# Patient Record
Sex: Male | Born: 1980 | Race: White | Hispanic: No | Marital: Single | Smoking: Never smoker
Health system: Southern US, Community
[De-identification: ages and names within clinical notes are randomized; demographics above are authoritative.]

## PROBLEM LIST (undated history)

## (undated) DIAGNOSIS — I10 Essential (primary) hypertension: Secondary | ICD-10-CM

---

## 2008-06-13 ENCOUNTER — Ambulatory Visit (HOSPITAL_BASED_OUTPATIENT_CLINIC_OR_DEPARTMENT_OTHER): Admission: RE | Admit: 2008-06-13 | Discharge: 2008-06-13 | Payer: Self-pay | Admitting: Orthopedic Surgery

## 2010-03-07 ENCOUNTER — Ambulatory Visit: Payer: Self-pay | Admitting: Diagnostic Radiology

## 2010-03-07 ENCOUNTER — Emergency Department (HOSPITAL_BASED_OUTPATIENT_CLINIC_OR_DEPARTMENT_OTHER): Admission: EM | Admit: 2010-03-07 | Discharge: 2010-03-07 | Payer: Self-pay | Admitting: Emergency Medicine

## 2011-01-26 NOTE — Op Note (Signed)
NAME:  Juan Ayala, Juan Ayala               ACCOUNT NO.:  [id number]   MEDICAL RECORD NO.:  [id number]          PATIENT TYPE:  AMB   LOCATION:  DSC                          FACILITY:  [facility]   PHYSICIAN:  Katy Fitch. Sypher, M.D. DATE OF BIRTH:  29-Apr-1981   DATE OF PROCEDURE:  06/13/2008  DATE OF DISCHARGE:                               OPERATIVE REPORT   PREOPERATIVE DIAGNOSIS:  Unstable right small finger metacarpal neck  fracture.   POSTOPERATIVE DIAGNOSIS:  Unstable right small finger metacarpal neck  fracture.   OPERATION:  Open reduction and internal fixation of right small finger  metacarpal neck fracture.   OPERATING SURGEON:  Katy Fitch. Sypher, MD   ASSISTANT:  Marveen Reeks Dasnoit, PA-C   ANESTHESIA:  General by LMA.   SUPERVISING ANESTHESIOLOGIST:  Janetta Hora. Gelene Mink, MD   INDICATIONS:  Amaar Oshita is a 30 year old right-hand dominant  gentleman, who sustained a crushing injury to his right hand earlier  this week, sustaining a mildly comminuted, angulated fracture of the  right small finger metacarpal neck at the metaphyseal-epiphyseal  junction.  This was significantly displaced and unstable.   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  Clinical examination revealed mild  malrotation of the small finger and apex dorsal angulation.  We  recommended a closed reduction and percutaneous Kirschner wire fixation  to obtain and maintain an anatomic reduction.   Preoperatively, Mr. Cowden was advised the potential risks and benefits of  surgery.  Questions were invited and answered in detail.   PROCEDURE:  Kelsie Kramp was brought to the operating room and placed  in supine position upon the operating table.   Following the induction of general anesthesia by LMA technique, the  right arm was prepped with Betadine soap solution and sterilely draped.  A pneumatic tourniquet was applied at the proximal brachium.   Following  exsanguination of the right arm with an Esmarch bandage,  arterial tourniquet was inflated to 220 mmHg.  Procedure commenced with  a closed reduction maneuver with 90/90 flexion of the MP and PIP joints.  Rotation was corrected and with 3-point molding, a snap was heard  reducing the fracture anatomically.   Two 0.045-inch Kirschner wires were placed on an oblique angle across  the small and ring finger metacarpal heads, the small and ring finger  metacarpal metaphyses, and an oblique pin placed through the epiphysis  of the small finger metacarpal head into the metaphysis of the ring  finger metacarpal.  A stable construct was achieved.  The C-arm  fluoroscopic images were used, AP, lateral, and oblique to confirm  satisfactory reduction and pin placement.   The pins were trimmed, bent in the usual manner, dressed with Xeroflo,  followed by application of a sterile gauze and Webril bandage with a  sandwich splint maintaining the ring and small fingers in the safe  position.   There were no apparent complications.   Mr. Graumann tolerated the surgery and anesthesia well.  He was transferred  to recovery room in stable signs.   He will be discharged to  the care of his family with a preexisting  prescription for Percocet that he had prior to surgery.  He was provided  vancomycin 1 g IV intraoperatively and will not be provided further  prophylactic antibiotics.       Katy Fitch Sypher, M.D.  Electronically Signed     RVS/MEDQ  D:  06/13/2008  T:  06/14/2008  Job:  [id number]

## 2011-06-15 LAB — POCT HEMOGLOBIN-HEMACUE: Hemoglobin: 15.6

## 2011-12-14 IMAGING — CR DG FINGER INDEX 2+V*L*
3 series · 3 of 3 positions shown · non-contrast
Comparison: None.

CLINICAL DATA: Crush injury to index finger.  Finger bruising and
pain.

LEFT INDEX FINGER 2+V

[x finger pa left]
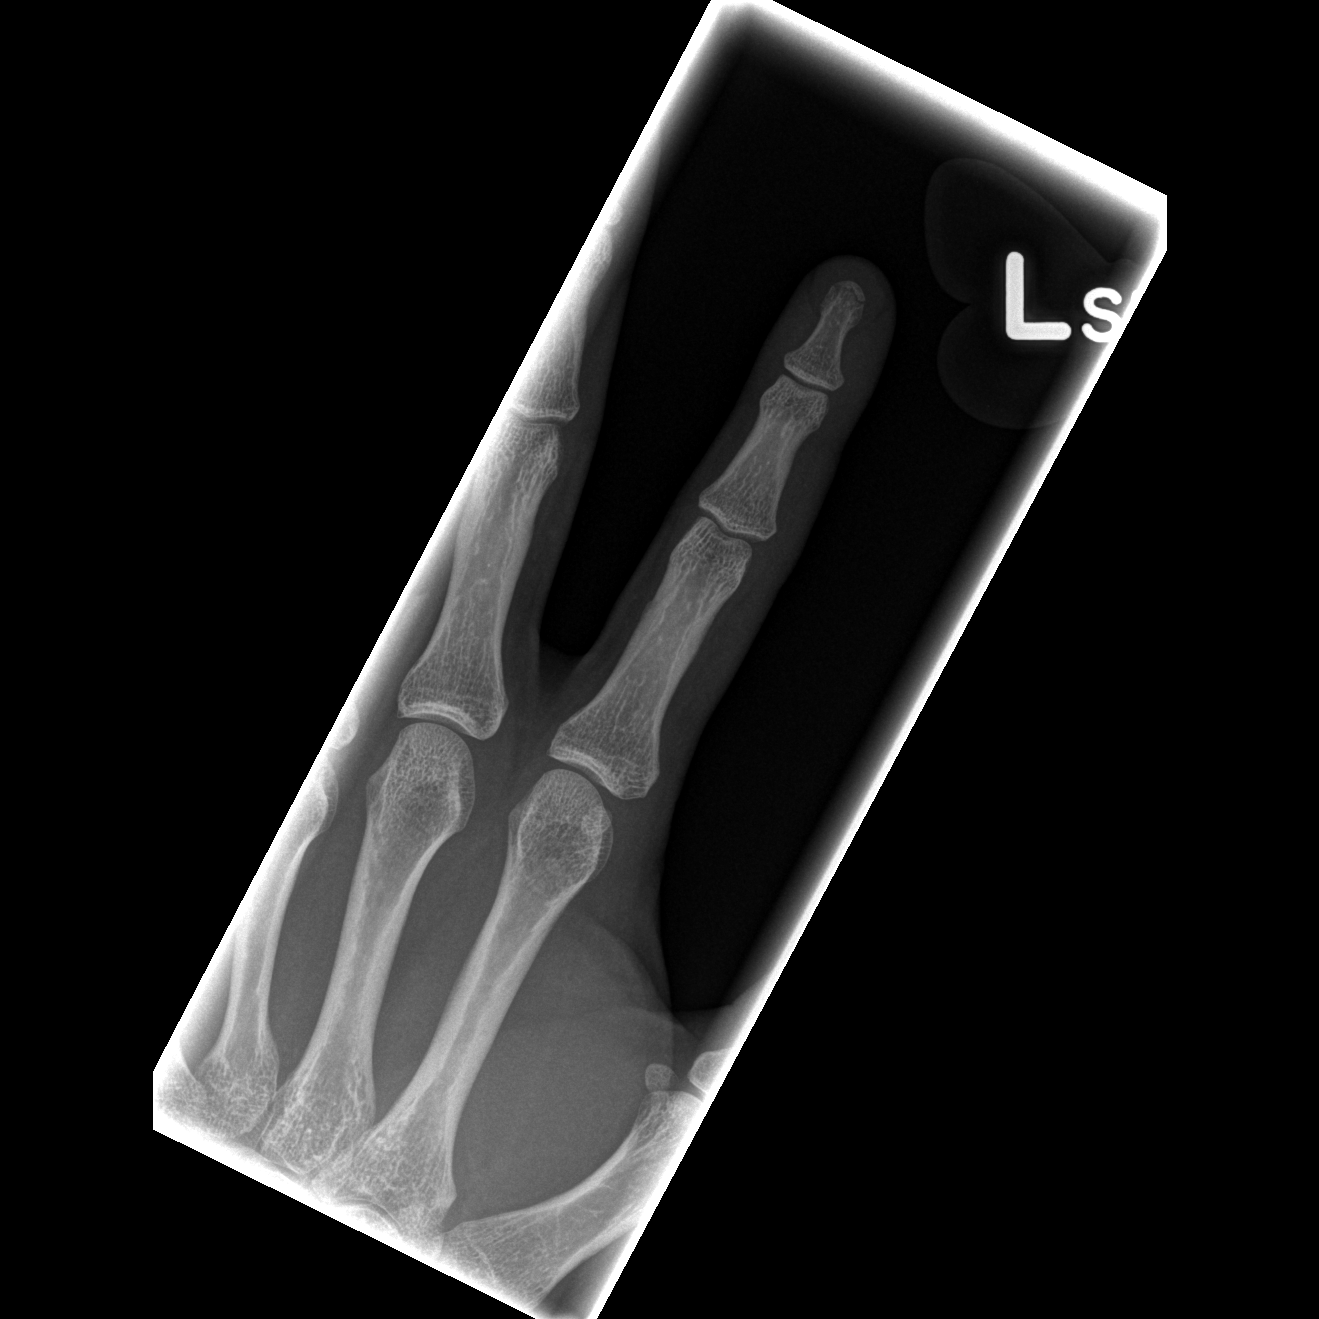

[x finger obl. left]
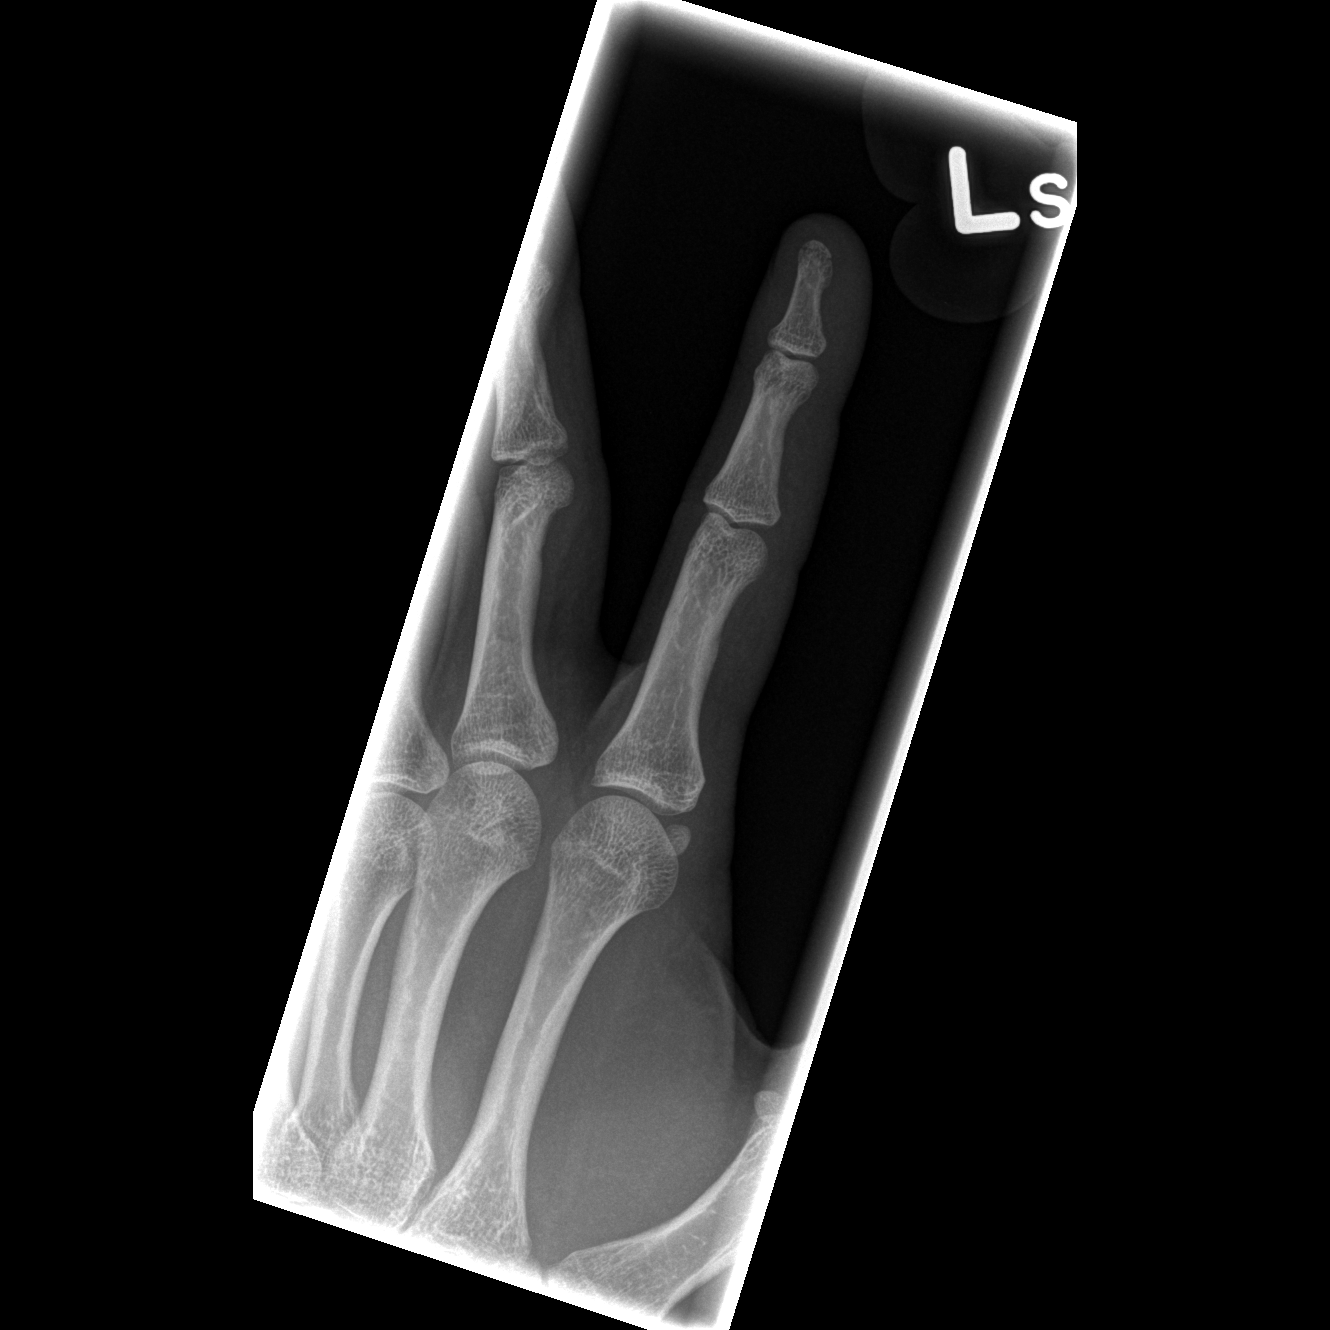

[x finger lateral left]
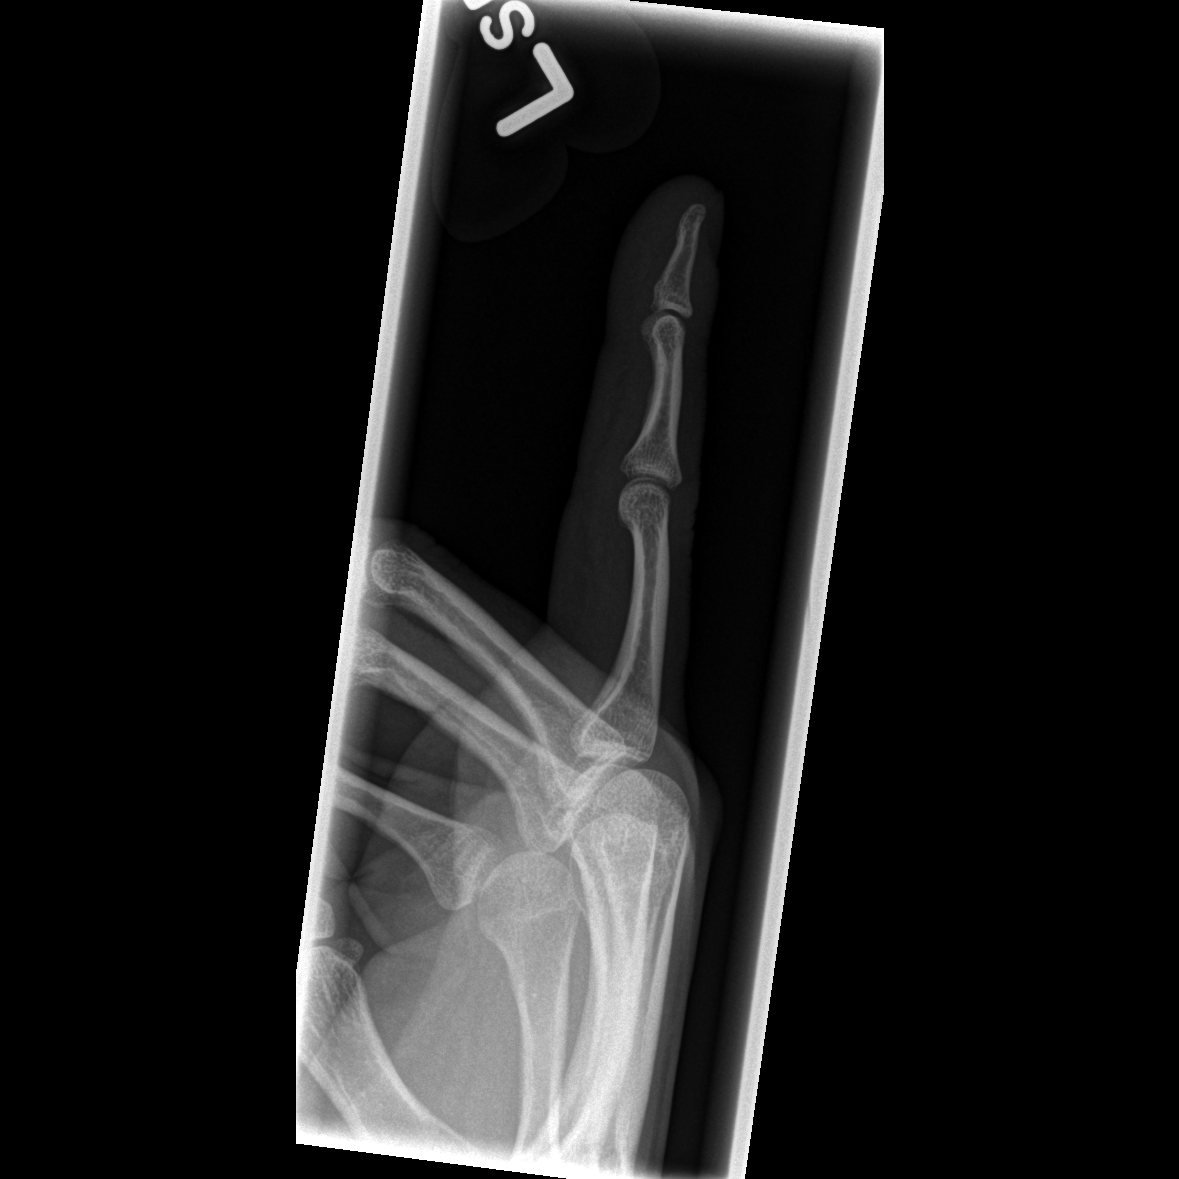

[3 of 3 positions shown; findings below may reference images not displayed]

FINDINGS: A nondisplaced fracture is seen involving the terminal
tuft of the distal phalanx.  No other fractures identified.  No
evidence of dislocation.
IMPRESSION: Nondisplaced fracture of the terminal tuft of the distal phalanx.

## 2016-11-05 ENCOUNTER — Emergency Department (HOSPITAL_BASED_OUTPATIENT_CLINIC_OR_DEPARTMENT_OTHER)
Admission: EM | Admit: 2016-11-05 | Discharge: 2016-11-05 | Disposition: A | Discharge status: Home / Self Care | Payer: Worker's Compensation | Attending: Emergency Medicine | Admitting: Emergency Medicine

## 2016-11-05 ENCOUNTER — Encounter (HOSPITAL_BASED_OUTPATIENT_CLINIC_OR_DEPARTMENT_OTHER): Payer: Self-pay | Admitting: *Deleted

## 2016-11-05 DIAGNOSIS — Y9389 Activity, other specified: Secondary | ICD-10-CM | POA: Insufficient documentation

## 2016-11-05 DIAGNOSIS — Z23 Encounter for immunization: Secondary | ICD-10-CM | POA: Insufficient documentation

## 2016-11-05 DIAGNOSIS — W268XXA Contact with other sharp object(s), not elsewhere classified, initial encounter: Secondary | ICD-10-CM | POA: Diagnosis not present

## 2016-11-05 DIAGNOSIS — Y929 Unspecified place or not applicable: Secondary | ICD-10-CM | POA: Insufficient documentation

## 2016-11-05 DIAGNOSIS — Y990 Civilian activity done for income or pay: Secondary | ICD-10-CM | POA: Insufficient documentation

## 2016-11-05 DIAGNOSIS — S61412A Laceration without foreign body of left hand, initial encounter: Secondary | ICD-10-CM

## 2016-11-05 MED ORDER — TETANUS-DIPHTH-ACELL PERTUSSIS 5-2.5-18.5 LF-MCG/0.5 IM SUSP
0.5000 mL | Freq: Once | INTRAMUSCULAR | Status: DC
  Filled 2016-11-05: qty 0.5

## 2016-11-05 MED ORDER — TETANUS-DIPHTH-ACELL PERTUSSIS 5-2.5-18.5 LF-MCG/0.5 IM SUSP
0.5000 mL | Freq: Once | INTRAMUSCULAR | Status: AC
  Administered 2016-11-05: 0.5 mL via INTRAMUSCULAR

## 2016-11-05 NOTE — ED Notes (Signed)
Approached edp about DC papers. She will do them now;

## 2016-11-05 NOTE — ED Provider Notes (Signed)
[facility]   CSN: [id number] Arrival date & time: 11/05/16  1558 By signing my name below, I, Levon HedgerElizabeth Hall, attest that this documentation has been prepared under the direction and in the presence of Arby BarretteMarcy Natasha Paulson, MD . Electronically Signed: Levon HedgerElizabeth Hall, Scribe. 11/05/2016. 6:18 PM.   History   Chief Complaint Chief Complaint  Patient presents with  . Hand Pain   HPI Juan Ayala is a 36 y.o. male who presents to the Emergency Department for evaluation of a mildly painful laceration to his left hand sustained today prior to arrival. Per pt, he cut himself on a clean, unused piece of sheet metal while at work today. 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 Pt states he applied neosporin to the area PTA. He does endorse having a mild cough a a few weeks ago, but is otherwise in his normal health. He denies any weakness, numbness, or recent fevers or chills. Pt has no other complaints or symptoms at this time.  The history is provided by the patient. No language interpreter was used.   History reviewed. No pertinent past medical history.  There are no active problems to display for this patient.  History reviewed. No pertinent surgical history.   Home Medications    Prior to Admission medications   Not on File    Family History History reviewed. No pertinent family history.  Social History Social History  Substance Use Topics  . Smoking status: Never Smoker  . Smokeless tobacco: Never Used  . Alcohol use Not on file    Allergies   Amoxicillin; Penicillins; and Septra [sulfamethoxazole-trimethoprim]  Review of Systems Review of Systems 10 Systems reviewed and are negative for acute change except as noted in the HPI.   Physical Exam Updated Vital Signs BP 133/91 (BP Location: Right Arm)   Pulse 85   Temp 98.7 F (37.1 C) (Oral)   Resp 20   Ht 5\' 11"  (1.803 m)   Wt 168 lb (76.2 kg)   SpO2 98%    BMI 23.43 kg/m   Physical Exam  Constitutional: He is oriented to person, place, and time. He appears well-developed and well-nourished. No distress.  HENT:  Head: Normocephalic and atraumatic.  Eyes: Conjunctivae are normal.  Cardiovascular: Normal rate.   Pulmonary/Chest: Effort normal.  Abdominal: He exhibits no distension.  Neurological: He is alert and oriented to person, place, and time.  Skin: Skin is warm and dry.  1 cm superficial laceration to the left palm hypothenar eminence to deep dermis. Normal ROM of fingers.   Psychiatric: He has a normal mood and affect.  Nursing note and vitals reviewed.  ED Treatments / Results  DIAGNOSTIC STUDIES:  Oxygen Saturation is 100% on RA, normal by my interpretation.    COORDINATION OF CARE:  6:16 PM Discussed treatment plan with pt at bedside and pt agreed to plan.   Labs (all labs ordered are listed, but only abnormal results are displayed) Labs Reviewed - No data to display  EKG  EKG Interpretation None       Radiology No results found.  Procedures .Marland Kitchen.Laceration Repair Date/Time: 11/05/2016 6:19 AM Performed by: Arby BarrettePFEIFFER, Chauncy Mangiaracina Authorized by: Arby BarrettePFEIFFER, Yani Lal   Consent:    Consent obtained:  Verbal   Consent given by:  Patient   Alternatives discussed:  No treatment Anesthesia (see MAR for exact dosages):    Anesthesia method:  None Laceration details:    Location:  Hand  Hand location:  L palm   Length (cm):  1 Exploration:    Wound extent: no foreign bodies/material noted, no muscle damage noted, no nerve damage noted, no tendon damage noted, no underlying fracture noted and no vascular damage noted     Contaminated: no   Treatment:    Area cleansed with:  Betadine   Amount of cleaning:  Standard   Visualized foreign bodies/material removed: no   Skin repair:    Repair method:  Steri-Strips Post-procedure details:    Dressing:  Sterile dressing   Patient tolerance of procedure:  Tolerated well, no  immediate complications   (including critical care time)  Medications Ordered in ED Medications  Tdap (BOOSTRIX) injection 0.5 mL (0.5 mLs Intramuscular Given 11/05/16 1700)    Initial Impression / Assessment and Plan / ED Course  I have reviewed the triage vital signs and the nursing notes.  Pertinent labs & imaging results that were available during my care of the patient were reviewed by me and considered in my medical decision making (see chart for details).      Final Clinical Impressions(s) / ED Diagnoses   Final diagnoses:  Laceration of left hand without foreign body, initial encounter   Patient has superficial laceration to the deep dermis of the palm. Wound is clean. Cleaned and closed with tissue glue and Steri-Strips. New Prescriptions New Prescriptions   No medications on file     Arby Barrette, MD 11/05/16 [phone]

## 2016-11-05 NOTE — ED Triage Notes (Signed)
Pt reports puncture wound from piece of metal to left hand. Unsure of td, dressing in place, no obvious bleeding noted.

## 2016-11-05 NOTE — ED Notes (Signed)
ED Provider at bedside. 

## 2023-10-27 ENCOUNTER — Other Ambulatory Visit: Payer: Self-pay

## 2023-10-27 ENCOUNTER — Emergency Department (HOSPITAL_BASED_OUTPATIENT_CLINIC_OR_DEPARTMENT_OTHER)
Admission: EM | Admit: 2023-10-27 | Discharge: 2023-10-27 | Disposition: A | Discharge status: Home / Self Care | Payer: Worker's Compensation

## 2023-10-27 ENCOUNTER — Encounter (HOSPITAL_BASED_OUTPATIENT_CLINIC_OR_DEPARTMENT_OTHER): Payer: Self-pay | Admitting: Emergency Medicine

## 2023-10-27 ENCOUNTER — Emergency Department (HOSPITAL_BASED_OUTPATIENT_CLINIC_OR_DEPARTMENT_OTHER): Payer: Self-pay | Admitting: Radiology

## 2023-10-27 DIAGNOSIS — S61011A Laceration without foreign body of right thumb without damage to nail, initial encounter: Secondary | ICD-10-CM | POA: Insufficient documentation

## 2023-10-27 DIAGNOSIS — W268XXA Contact with other sharp object(s), not elsewhere classified, initial encounter: Secondary | ICD-10-CM | POA: Insufficient documentation

## 2023-10-27 DIAGNOSIS — S60931A Unspecified superficial injury of right thumb, initial encounter: Secondary | ICD-10-CM | POA: Diagnosis present

## 2023-10-27 DIAGNOSIS — Z23 Encounter for immunization: Secondary | ICD-10-CM | POA: Diagnosis not present

## 2023-10-27 HISTORY — DX: Essential (primary) hypertension: I10

## 2023-10-27 MED ORDER — LIDOCAINE HCL (PF) 1 % IJ SOLN
5.0000 mL | Freq: Once | INTRAMUSCULAR | Status: AC
  Administered 2023-10-27: 5 mL
  Filled 2023-10-27: qty 5

## 2023-10-27 MED ORDER — TETANUS-DIPHTH-ACELL PERTUSSIS 5-2.5-18.5 LF-MCG/0.5 IM SUSY
0.5000 mL | PREFILLED_SYRINGE | Freq: Once | INTRAMUSCULAR | Status: AC
  Administered 2023-10-27: 0.5 mL via INTRAMUSCULAR
  Filled 2023-10-27: qty 0.5

## 2023-10-27 NOTE — ED Notes (Signed)
Discharge paperwork given and verbally understood.

## 2023-10-27 NOTE — ED Provider Notes (Signed)
[place] EMERGENCY DEPARTMENT AT [facility] Provider Note   CSN: [id number] Arrival date & time: 10/27/23  2952     History Chief Complaint  Patient presents with   Laceration    Romey Mathieson is a 43 y.o. male.  Patient presents to the emergency department with concerns of a laceration to the right thumb.  He reports that he was working and he struck a steel plate on the edge of a wall.  States that the wound has been oozing since the injury.  Not on blood thinners.  Unsure of last Tdap.  Believes last Tdap was maybe greater than 5 years ago.    Laceration      Home Medications Prior to Admission medications   Not on File      Allergies    Amoxicillin, Penicillins, and Septra [sulfamethoxazole-trimethoprim]    Review of Systems   Review of Systems  Skin:  Positive for wound.  All other systems reviewed and are negative.   Physical Exam Updated Vital Signs BP (!) 146/100   Pulse 77   Temp 98 F (36.7 C)   Resp 16   Ht 5\' 11"  (1.803 m)   Wt 79.4 kg   SpO2 99%   BMI 24.41 kg/m  Physical Exam Vitals and nursing note reviewed.  Constitutional:      General: He is not in acute distress.    Appearance: Normal appearance. He is not ill-appearing.  HENT:     Head: Normocephalic and atraumatic.  Eyes:     General: No scleral icterus.    Conjunctiva/sclera: Conjunctivae normal.  Skin:    General: Skin is warm.     Capillary Refill: Capillary refill takes less than 2 seconds.     Coloration: Skin is not jaundiced.     Findings: Lesion present. No bruising or erythema.     Comments: 2 cm crescent-shaped laceration to the distal tip of the right thumb.  Minimal bleeding.  Neurological:     Mental Status: He is alert.     ED Results / Procedures / Treatments   Labs (all labs ordered are listed, but only abnormal results are displayed) Labs Reviewed - No data to display  EKG None  Radiology DG Finger Thumb Right Result Date:  10/27/2023 CLINICAL DATA:  Right thumb laceration. EXAM: RIGHT THUMB 2+V COMPARISON:  None Available. FINDINGS: There is no evidence of fracture or dislocation. There is no evidence of arthropathy or other focal bone abnormality. Bandage material overlying the distal thumb. No radiopaque foreign body. IMPRESSION: 1. No acute osseous abnormality or radiopaque foreign body. Electronically Signed   By: Obie Dredge M.D.   On: 10/27/2023 10:30    Procedures .Laceration Repair  Date/Time: 10/27/2023 10:33 AM  Performed by: Smitty Knudsen, PA-C Authorized by: Smitty Knudsen, PA-C   Consent:    Consent obtained:  Verbal   Consent given by:  Patient   Risks discussed:  Infection, pain and poor cosmetic result   Alternatives discussed:  No treatment and referral Anesthesia:    Anesthesia method:  Topical application   Topical anesthesia: lidocaine. Laceration details:    Location:  Finger   Finger location:  R thumb   Length (cm):  2   Depth (mm):  2 Pre-procedure details:    Preparation:  Imaging obtained to evaluate for foreign bodies Exploration:    Hemostasis achieved with:  Direct pressure   Imaging obtained: x-ray     Imaging outcome: foreign body not noted  Treatment:    Area cleansed with:  Saline and chlorhexidine   Amount of cleaning:  Standard   Irrigation solution:  Sterile water   Irrigation volume:  100cc   Irrigation method:  Syringe   Visualized foreign bodies/material removed: no     Debridement:  None Skin repair:    Repair method:  Sutures   Suture size:  6-0   Suture material:  Prolene   Suture technique:  Simple interrupted   Number of sutures:  2 Approximation:    Approximation:  Close Repair type:    Repair type:  Simple Post-procedure details:    Dressing:  Bulky dressing   Procedure completion:  Tolerated     Medications Ordered in ED Medications  lidocaine (PF) (XYLOCAINE) 1 % injection 5 mL (5 mLs Other Given by Other 10/27/23 0943)  Tdap  (BOOSTRIX) injection 0.5 mL (0.5 mLs Intramuscular Given 10/27/23 0947)    ED Course/ Medical Decision Making/ A&P Clinical Course as of 10/27/23 1037  Thu Oct 27, 2023  0948 Lidocaine soaked gauze placed [OZ]  1032 DG Finger Thumb Right [OZ]    Clinical Course User Index [OZ] Smitty Knudsen, PA-C                                 Medical Decision Making Risk Prescription drug management.   This patient presents to the ED for concern of laceration.  Differential diagnosis includes distal tuft fracture, superficial laceration, nail avulsion   Medicines ordered and prescription drug management:  I ordered medication including lidocaine for anesthesia Reevaluation of the patient after these medicines showed that the patient improved I have reviewed the patients home medicines and have made adjustments as needed   Problem List / ED Course:  Patient presents the emergency department concerns of laceration.  Reports this occurred about an hour prior to arriving. States that he was at work and he struck the corner of a steel plate.  Had a minor penetrating injury with a crescent shaped wound that has resulted.  States the wound has been oozing.  Not on blood thinners.  Denies any tingling, numbness, or altered sensation.  Tdap potentially out of date as he feels that his last was greater than 5 years ago. On exam, patient has approximately 2 cm crescent-shaped laceration to the tip of the right thumb. Discussed anesthetic approach with patient.  Decided on lidocaine soaked gauze and allowed the area to penetrate for several minutes before attempting suture repair. Repair performed without significant discomfort.  2 sutures placed.  Discussed return precautions such as evidence of infection.  Encourage patient to have the area evaluated in about 7 to 10 days for wound check to determine if the sutures are ready to be removed.  Patient verbalized understanding.  Otherwise stable at this time  and discharged home.  Final Clinical Impression(s) / ED Diagnoses Final diagnoses:  Laceration of right thumb without foreign body without damage to nail, initial encounter    Rx / DC Orders ED Discharge Orders     None         Smitty Knudsen, PA-C 10/27/23 1037    Coral Spikes, DO 10/27/23 1605

## 2023-10-27 NOTE — ED Triage Notes (Signed)
Laceration to R thumb while taking a steel plate off of a wall. Wound is oozing.

## 2023-10-27 NOTE — Discharge Instructions (Signed)
You are seen in the emergency department today for concerns of a laceration.  Imaging did not show any foreign objects in the wound.  The wound was sutured with 2 Prolene sutures placed.  This is a nonabsorbable suture.  He should have the wound rechecked in 7 to 10 days to determine if the sutures are ready be removed.  If any signs of infection develop such as significant redness, pain, discharge or drainage, please return to the emergency department or seek medical evaluation as you likely have an infection that will require treatment.
